# Patient Record
Sex: Female | Born: 2000 | Race: Black or African American | Hispanic: No | Marital: Single | Smoking: Never smoker
Health system: Southern US, Community
[De-identification: ages and names within clinical notes are randomized; demographics above are authoritative.]

---
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
## 2022-04-11 ENCOUNTER — Encounter: Payer: Self-pay | Admitting: Advanced Practice Midwife

## 2022-04-11 ENCOUNTER — Ambulatory Visit (INDEPENDENT_AMBULATORY_CARE_PROVIDER_SITE_OTHER): Payer: Medicaid Other | Admitting: Advanced Practice Midwife

## 2022-04-11 VITALS — BP 120/83 | HR 78 | Ht 62.0 in | Wt 179.8 lb

## 2022-04-11 DIAGNOSIS — R140 Abdominal distension (gaseous): Secondary | ICD-10-CM | POA: Diagnosis not present

## 2022-04-11 DIAGNOSIS — R102 Pelvic and perineal pain: Secondary | ICD-10-CM | POA: Diagnosis not present

## 2022-04-11 DIAGNOSIS — N913 Primary oligomenorrhea: Secondary | ICD-10-CM

## 2022-04-11 NOTE — Progress Notes (Signed)
   GYNECOLOGY PROGRESS NOTE  History:  21 y.o. G0 presents to [facility] office today for problem gyn visit. She reports irregular menses since menarche. Periods are 2-3 months to 6 months apart.  She reports some cramping with menses and in between, and pain with intercourse since becoming sexually active.  She denies h/a, dizziness, shortness of breath, n/v, or fever/chills.    The following portions of the patient's history were reviewed and updated as appropriate: allergies, current medications, past family history, past medical history, past social history, past surgical history and problem list.   Health Maintenance Due  Topic Date Due   COVID-19 Vaccine (1) Never done   HPV VACCINES (1 - 2-dose series) Never done   CHLAMYDIA SCREENING  Never done   HIV Screening  Never done   Hepatitis C Screening  Never done   TETANUS/TDAP  Never done     Review of Systems:  Pertinent items are noted in HPI.   Objective:  Physical Exam Blood pressure 120/83, pulse 78, height 5\' 2"  (1.575 m), weight 179 lb 12.8 oz (81.6 kg). VS reviewed, nursing note reviewed,  Constitutional: well developed, well nourished, no distress HEENT: normocephalic CV: normal rate Pulm/chest wall: normal effort Breast Exam: deferred Abdomen: soft Neuro: alert and oriented x 3 Skin: warm, dry Psych: affect normal Pelvic exam: Cervix pink, visually closed, without lesion, scant white creamy discharge, vaginal walls and external genitalia normal Bimanual exam: Cervix 0/long/high, firm, anterior, neg CMT, uterus nontender, nonenlarged, adnexa without tenderness, enlargement, or mass  Assessment & Plan:  1. Primary oligomenorrhea --discussed PCOS with pt, who has done her research and also thinks that is what she has --Some hyperandrogen symptoms like hair on face/chest, oily skin, and acne --Discussed how mainstay of tx is OCPs. Pt concerned about hormones and unsure about taking OCPs.  She is sexually active.   Recommend condoms for contraception for now.  --Outpatient and f/u in 3 months  - POCT urine pregnancy - TSH - FSH - Testosterone - Prolactin - US PELVIC COMPLETE WITH TRANSVAGINAL; Future - Beta hCG quant (ref lab)  2. Pelvic pain in female  - US PELVIC COMPLETE WITH TRANSVAGINAL; Future    3. Abdominal bloating    Return in about 3 months (around 07/12/2022) for Gyn follow up for Abnormal Uterine Bleeding with me.   07/14/2022, CNM 9:03 AM

## 2022-04-11 NOTE — Progress Notes (Signed)
NGYN presents to establish care. Pt states her periods are irregular. She has not had a period in 2-3 months and c/o of bloating and weight gain.

## 2022-04-12 LAB — TSH: TSH: 2.51 u[IU]/mL (ref 0.450–4.500)

## 2022-04-12 LAB — FOLLICLE STIMULATING HORMONE: FSH: 6.2 m[IU]/mL

## 2022-04-12 LAB — PROLACTIN: Prolactin: 17.6 ng/mL (ref 4.8–23.3)

## 2022-04-12 LAB — TESTOSTERONE: Testosterone: 61 ng/dL (ref 13–71)

## 2022-04-12 LAB — BETA HCG QUANT (REF LAB): hCG Quant: <1 m[IU]/mL

## 2022-04-13 ENCOUNTER — Ambulatory Visit (HOSPITAL_COMMUNITY)
Admission: RE | Admit: 2022-04-13 | Discharge: 2022-04-13 | Disposition: A | Discharge status: Home / Self Care | Payer: Medicaid Other | Source: Ambulatory Visit | Attending: Advanced Practice Midwife | Admitting: Advanced Practice Midwife

## 2022-04-13 DIAGNOSIS — N913 Primary oligomenorrhea: Secondary | ICD-10-CM

## 2022-04-13 DIAGNOSIS — R102 Pelvic and perineal pain: Secondary | ICD-10-CM | POA: Diagnosis present

## 2022-04-18 ENCOUNTER — Telehealth: Payer: Self-pay

## 2022-07-10 ENCOUNTER — Ambulatory Visit: Payer: Medicaid Other | Admitting: Advanced Practice Midwife

## 2023-06-22 IMAGING — US US PELVIS COMPLETE WITH TRANSVAGINAL
1 series · 13 of 25 positions shown · non-contrast
Comparison: None Available.

CLINICAL DATA: Oligomenorrhea.  Pelvic pain.

EXAM:
TRANSABDOMINAL AND TRANSVAGINAL ULTRASOUND OF PELVIS
TECHNIQUE: Both transabdominal and transvaginal ultrasound examinations of the
pelvis were performed. Transabdominal technique was performed for
global imaging of the pelvis including uterus, ovaries, adnexal
regions, and pelvic cul-de-sac. It was necessary to proceed with
endovaginal exam following the transabdominal exam to visualize the
ovaries and endometrium.

[Series 1: us pelvic complete with transvaginal · 13 of 94 slices shown]
[im 1/94]
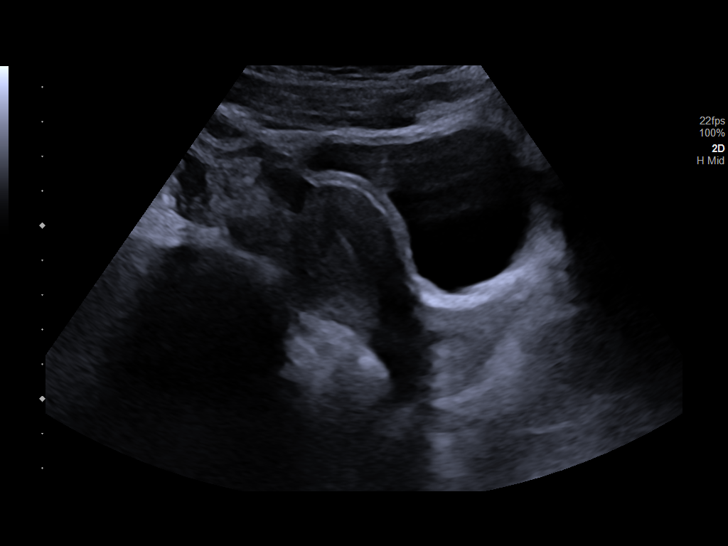
[im 8/94]
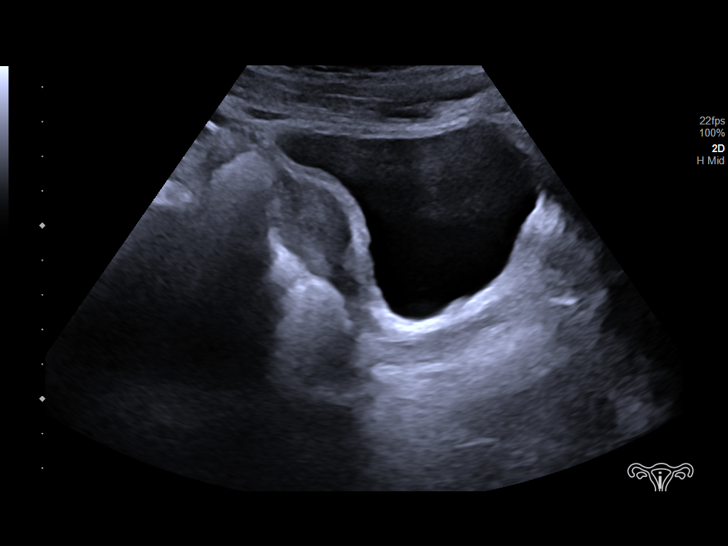
[im 16/94]
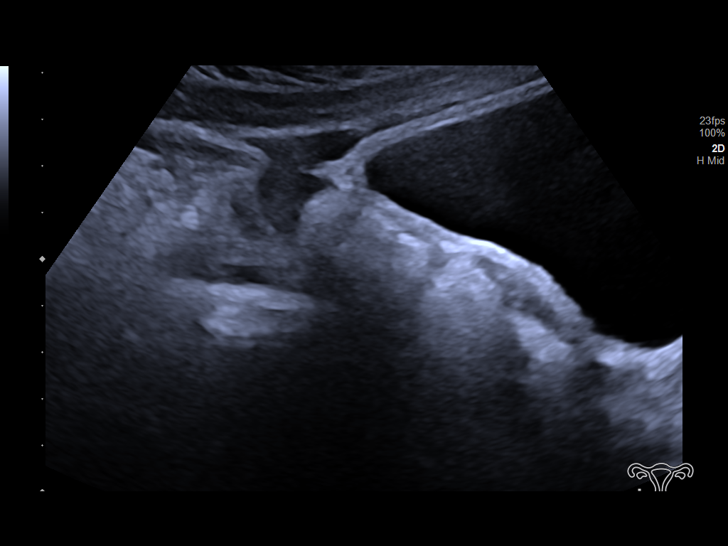
[im 24/94]
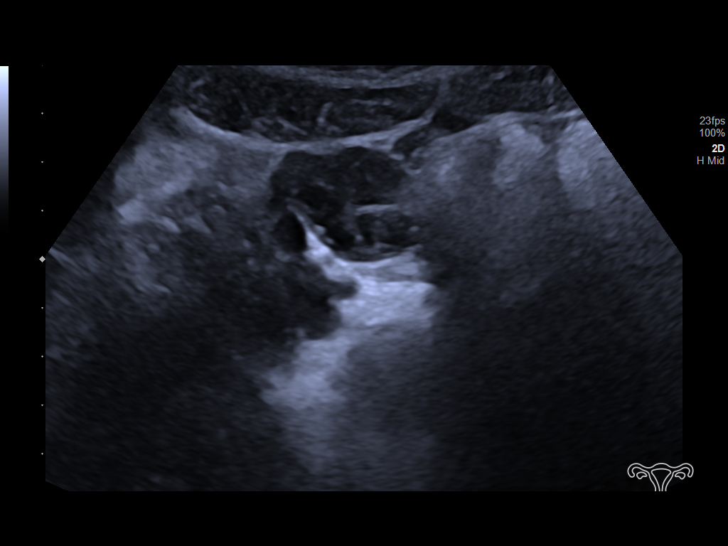
[im 32/94]
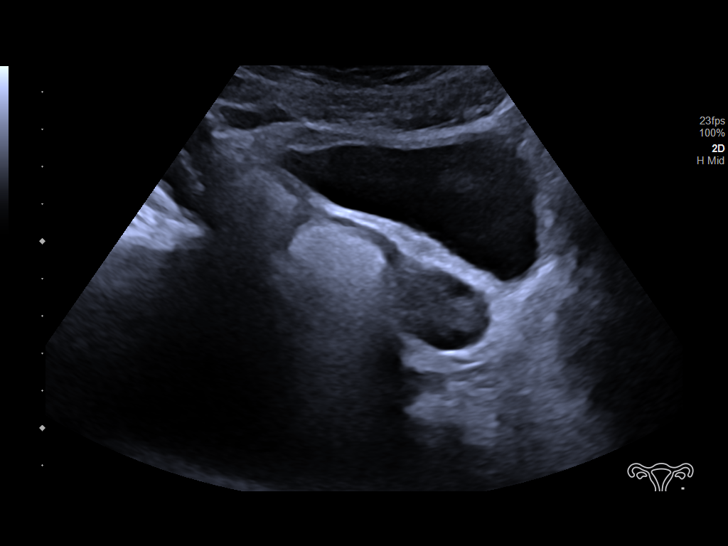
[im 39/94]
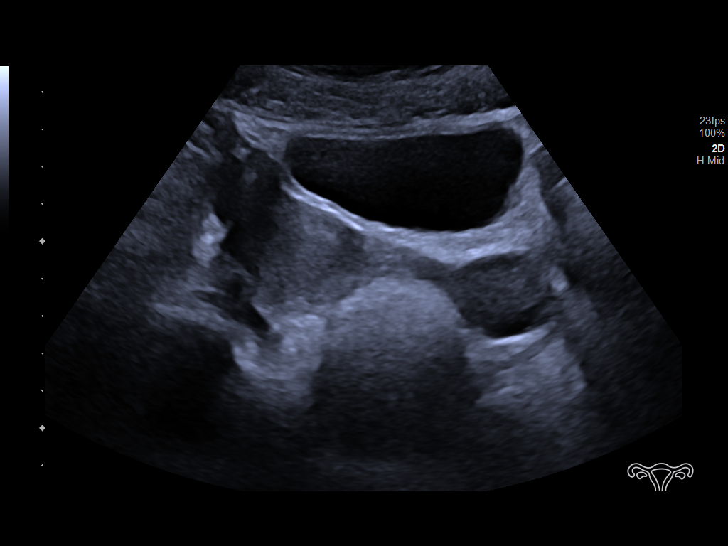
[im 47/94]
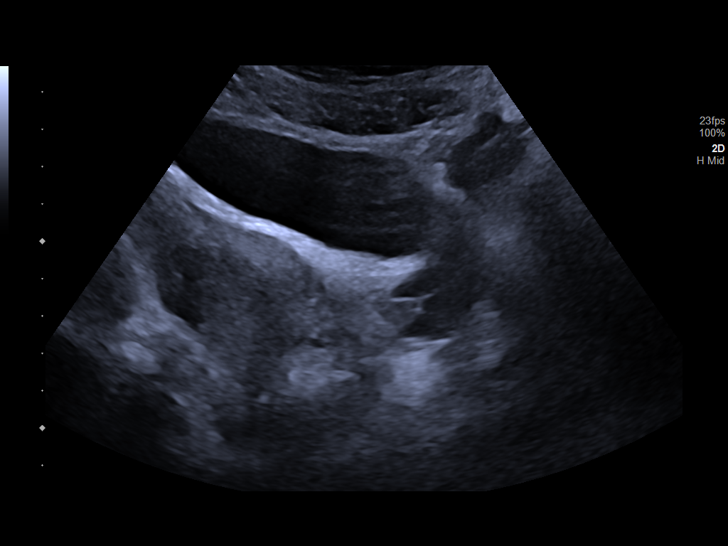
[im 55/94]
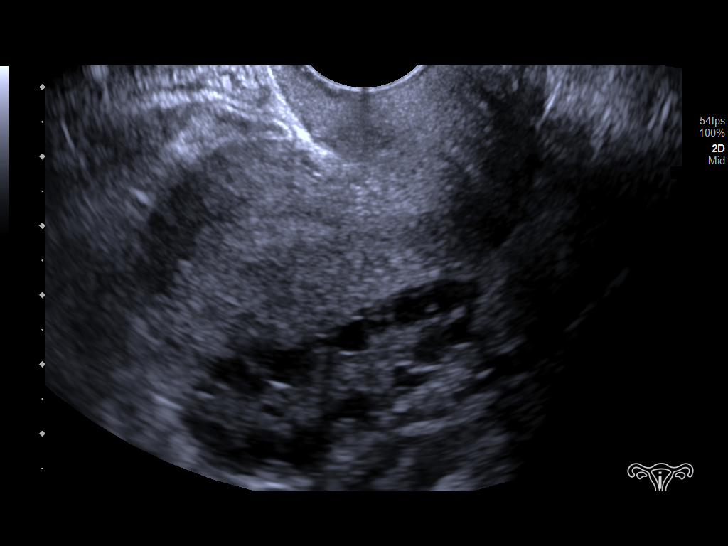
[im 63/94]
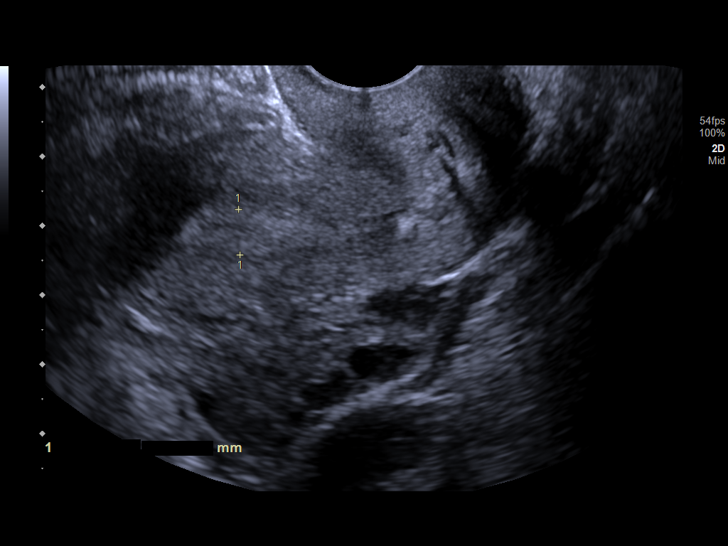
[im 70/94]
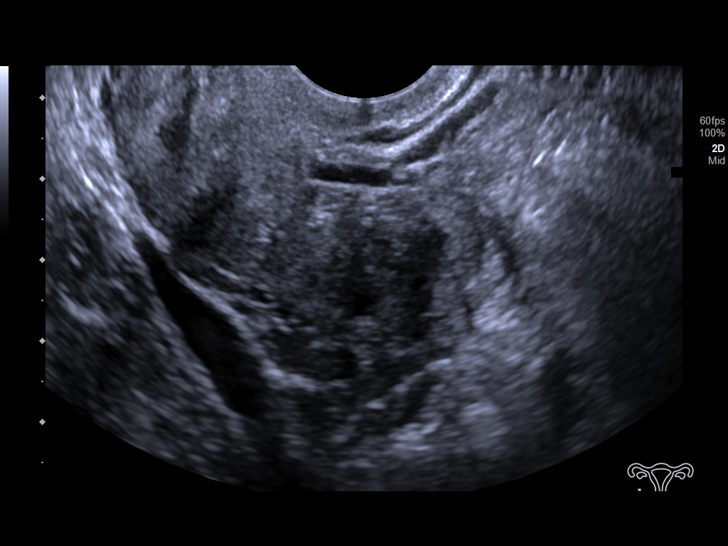
[im 78/94]
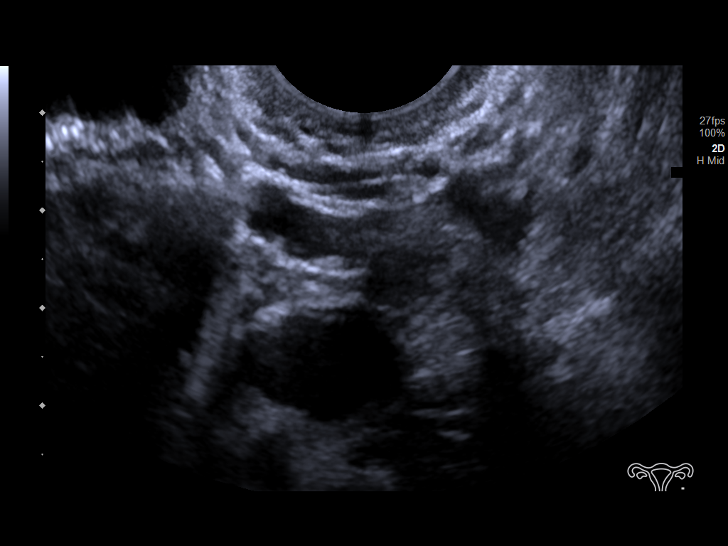
[im 86/94]
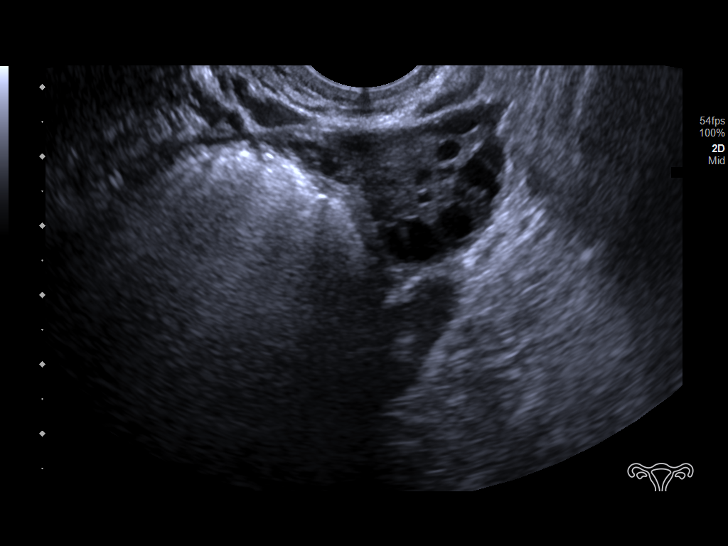
[im 94/94]
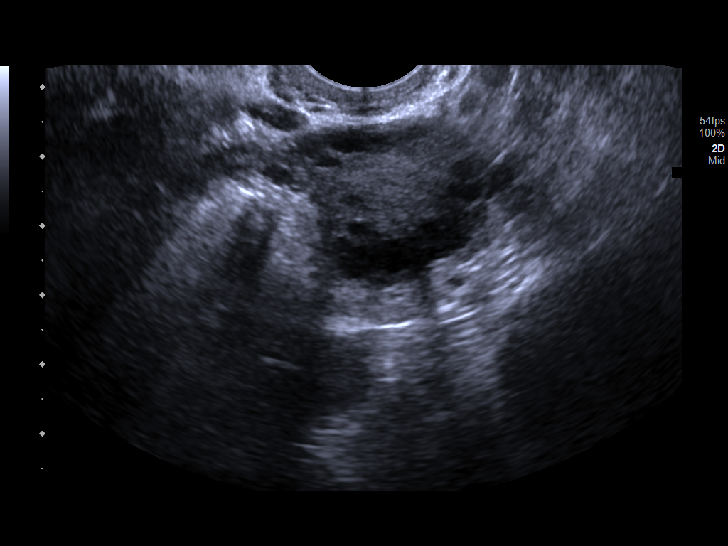

[13 of 25 positions shown; findings below may reference images not displayed]

FINDINGS: Uterus

Measurements: 7.3 x 2.9 x 3.6 cm = volume: 39.6 mL. No fibroids or
other mass visualized.

Endometrium

Thickness: 6.6 mm.  No focal abnormality visualized.

Right ovary

Measurements: 4.2 x 2.5 x 2.6 cm = volume: 14 mL. No adnexal mass.
Numerous small follicles are seen in the right ovary.

Left ovary

Measurements: 3.2 x 1.9 x 2.9 cm = volume: 9.2 mL. No adnexal mass.
Numerous small follicles are seen in the left ovary.

Other findings

No abnormal free fluid.
IMPRESSION: 1. The right ovary is mildly enlarged. There are numerous small
follicles in both ovaries. Findings can be seen in the setting of
polycystic ovarian syndrome. Please correlate clinically.

## 2024-01-24 ENCOUNTER — Ambulatory Visit: Payer: Medicaid Other | Admitting: Family Medicine

## 2024-03-25 ENCOUNTER — Encounter: Payer: Self-pay | Admitting: Family Medicine

## 2024-03-25 ENCOUNTER — Ambulatory Visit (INDEPENDENT_AMBULATORY_CARE_PROVIDER_SITE_OTHER): Admitting: Family Medicine

## 2024-03-25 VITALS — BP 98/65 | HR 85 | Ht 62.0 in | Wt 160.2 lb

## 2024-03-25 DIAGNOSIS — Z7689 Persons encountering health services in other specified circumstances: Secondary | ICD-10-CM | POA: Diagnosis not present

## 2024-03-25 DIAGNOSIS — E282 Polycystic ovarian syndrome: Secondary | ICD-10-CM | POA: Diagnosis not present

## 2024-03-25 NOTE — Patient Instructions (Addendum)
 It was nice to see you today,  We addressed the following topics today: - I will get some labs and discuss the results with you when I get them.   - if you decide you would like to start any medications for pcos, let me know and we can discuss it and send it in.   Have a great day,  Etha Henle, MD

## 2024-03-25 NOTE — Progress Notes (Signed)
 New Patient Office Visit  Subjective   Patient ID: Abigail Hayden, female    DOB: 10-09-2001  Age: 23 y.o. 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 Works as Arts development officer on campus. Denies tobacco, alcohol, or recreational drug use. Single. Not sexually active within past 6 months.  ROS: Irregular menses, excess facial hair growth (coarse hair around mouth and chin requiring waxing every few days), oily skin  Outpatient Encounter Medications as of 03/25/2024  Medication Sig   cholecalciferol (VITAMIN D3) 25 MCG (1000 UNIT) tablet Take 1,000 Units by mouth daily.   predniSONE  (DELTASONE ) 20 MG tablet Take 40 mg by mouth daily for 3 days, then 20mg  by mouth daily for 3 days, then 10mg  daily for 3 days (Patient not taking: Reported on 03/25/2024)   No facility-administered encounter medications on file as of 03/25/2024.    History reviewed. No pertinent past medical history.  History reviewed. No pertinent surgical history.  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[employer]   Tobacco Use   Smoking status: Never   Smokeless tobacco: Never  Substance and Sexual Activity   Alcohol use: No   Drug use: No   Sexual activity: Yes    Partners: Male    Birth control/protection: Condom  Other Topics Concern   Not on file  Social History Narrative   Not on file   Social Drivers of Health   Financial Resource Strain: Not on File (02/09/2022)   Received from [facility], [facility]    Financial Resource Strain: 0  Food Insecurity: Not on File (07/19/2023)   Received from [facility] Insecurity    Food: 0  Transportation Needs: Not on File (02/09/2022)   Received from [facility], [facility] Needs    Transportation: 0  Physical Activity: Not on File (02/09/2022)   Received from [place], [place]   Physical Activity    Physical Activity: 0  Stress: Not on File (02/09/2022)   Received from [place], [place]   Stress    Stress: 0  Social Connections: Not on File (07/07/2023)   Received from [facility]   Social Connections    Connectedness: 0  Intimate Partner Violence: Not on file    ROS     Objective   BP 98/65   Pulse 85   Ht 5\' 2"  (1.575 m)   Wt 160 lb 4 oz (72.7 kg)   SpO2 99%   BMI 29.31 kg/m   Physical Exam Gen: alert, oriented Heent: perrla, eomi, mmm Cv: rrr Pulm: lctab  Gi: soft, nbs Msk: strength equal, normal gait Ext:no pedal edema Psych: pleasant      Assessment & Plan:   Encounter to establish care  PCOS (polycystic ovarian syndrome) Assessment & Plan: - Recently diagnosed by gynecologist - Symptoms include irregular menses (3-4 months between cycles), excess facial hair growth requiring frequent waxing, oily skin - BP on lower side - Discussed treatment options including oral contraceptives vs IUD - Discussed potential side effects of birth control including mood changes, spotting with IUD - Discussed spironolactone for hirsutism but not recommended due to low BP - Discussed possibility of metformin  depending on lab results - Discussed cosmetic options for hair removal (electrolysis, laser) but patient notes financial constraints - pt declined treatment at this time.    Orders: -     CBC with Differential/Platelet; Future -     Comprehensive metabolic panel with GFR; Future -     Hemoglobin A1c; Future -     Lipid panel; Future    Return in about 1 year (around 03/25/2025) for physical.   Laneta Pintos, MD

## 2024-03-25 NOTE — Assessment & Plan Note (Addendum)
-   Recently diagnosed by gynecologist - Symptoms include irregular menses (3-4 months between cycles), excess facial hair growth requiring frequent waxing, oily skin - BP on lower side - Discussed treatment options including oral contraceptives vs IUD - Discussed potential side effects of birth control including mood changes, spotting with IUD - Discussed spironolactone for hirsutism but not recommended due to low BP - Discussed possibility of metformin depending on lab results - Discussed cosmetic options for hair removal (electrolysis, laser) but patient notes financial constraints - pt declined treatment at this time.

## 2024-05-01 ENCOUNTER — Other Ambulatory Visit

## 2024-05-01 DIAGNOSIS — E282 Polycystic ovarian syndrome: Secondary | ICD-10-CM

## 2024-05-02 ENCOUNTER — Ambulatory Visit: Payer: Self-pay | Admitting: Family Medicine

## 2024-05-02 LAB — COMPREHENSIVE METABOLIC PANEL WITH GFR
ALT: 13 IU/L (ref 0–32)
AST: 15 IU/L (ref 0–40)
Albumin: 4.3 g/dL (ref 4.0–5.0)
Alkaline Phosphatase: 63 IU/L (ref 44–121)
BUN/Creatinine Ratio: 16 (ref 9–23)
BUN: 12 mg/dL (ref 6–20)
Bilirubin Total: 0.5 mg/dL (ref 0.0–1.2)
CO2: 23 mmol/L (ref 20–29)
Calcium: 9.4 mg/dL (ref 8.7–10.2)
Chloride: 102 mmol/L (ref 96–106)
Creatinine, Ser: 0.75 mg/dL (ref 0.57–1.00)
Globulin, Total: 2.5 g/dL (ref 1.5–4.5)
Glucose: 83 mg/dL (ref 70–99)
Potassium: 4.3 mmol/L (ref 3.5–5.2)
Sodium: 139 mmol/L (ref 134–144)
Total Protein: 6.8 g/dL (ref 6.0–8.5)
eGFR: 116 mL/min/1.73 (ref >59)

## 2024-05-02 LAB — HEMOGLOBIN A1C
Est. average glucose Bld gHb Est-mCnc: 108 mg/dL
Hgb A1c MFr Bld: 5.4 % (ref 4.8–5.6)

## 2024-05-02 LAB — CBC WITH DIFFERENTIAL/PLATELET
Basophils Absolute: 0 x10E3/uL (ref 0.0–0.2)
Basos: 0 %
EOS (ABSOLUTE): 0.1 x10E3/uL (ref 0.0–0.4)
Eos: 2 %
Hematocrit: 41.2 % (ref 34.0–46.6)
Hemoglobin: 12.8 g/dL (ref 11.1–15.9)
Immature Grans (Abs): 0 x10E3/uL (ref 0.0–0.1)
Immature Granulocytes: 0 %
Lymphocytes Absolute: 1.5 x10E3/uL (ref 0.7–3.1)
Lymphs: 33 %
MCH: 27.1 pg (ref 26.6–33.0)
MCHC: 31.1 g/dL — ABNORMAL LOW (ref 31.5–35.7)
MCV: 87 fL (ref 79–97)
Monocytes Absolute: 0.4 x10E3/uL (ref 0.1–0.9)
Monocytes: 9 %
Neutrophils Absolute: 2.5 x10E3/uL (ref 1.4–7.0)
Neutrophils: 56 %
Platelets: 295 x10E3/uL (ref 150–450)
RBC: 4.72 x10E6/uL (ref 3.77–5.28)
RDW: 12.6 % (ref 11.7–15.4)
WBC: 4.6 x10E3/uL (ref 3.4–10.8)

## 2024-05-02 LAB — LIPID PANEL
Chol/HDL Ratio: 3.3 ratio (ref 0.0–4.4)
Cholesterol, Total: 199 mg/dL (ref 100–199)
HDL: 60 mg/dL (ref >39)
LDL Chol Calc (NIH): 131 mg/dL — ABNORMAL HIGH (ref 0–99)
Triglycerides: 41 mg/dL (ref 0–149)
VLDL Cholesterol Cal: 8 mg/dL (ref 5–40)

## 2024-07-03 ENCOUNTER — Telehealth: Payer: Self-pay

## 2024-07-03 DIAGNOSIS — Z789 Other specified health status: Secondary | ICD-10-CM

## 2024-07-03 NOTE — Telephone Encounter (Signed)
 Copied from CRM #[id number]. Topic: Appointments - Scheduling Inquiry for Clinic >> Jul 03, 2024  3:34 PM Emylou G wrote: Reason for CRM: Please call patient.SABRA going to [place] for 10 days next month 10/3 - wants to know if he have the items she needs to before she goes, vaccinations - or script she is suppose to get before she goes etc..

## 2024-07-14 NOTE — Telephone Encounter (Signed)
 Called pt she is advised of the information that was given

## 2024-07-14 NOTE — Addendum Note (Signed)
 Addended by: CHANDRA TORIBIO POUR on: 07/14/2024 08:02 AM   Modules accepted: Orders

## 2024-07-14 NOTE — Telephone Encounter (Signed)
 Please let her know that what she would need depends on which country she is traveling to.  There is a travel clinic through [place] she can call to see if she can schedule an appointment.  I have also sent in a referral to them.  There may be other travel clinics in [place] as well if she cannot get in with this one.    Their number is [phone].  The CDC has a webpage with helpful information sorted by country as well: [email or web address]

## 2025-03-19 ENCOUNTER — Other Ambulatory Visit

## 2025-03-26 ENCOUNTER — Encounter: Admitting: Family Medicine
